# Patient Record
Sex: Male | Born: 1935 | Race: White | Hispanic: No | Marital: Married | Smoking: Former smoker
Health system: Southern US, Community
[De-identification: ages and names within clinical notes are randomized; demographics above are authoritative.]

## PROBLEM LIST (undated history)

## (undated) DIAGNOSIS — S92919A Unspecified fracture of unspecified toe(s), initial encounter for closed fracture: Secondary | ICD-10-CM

## (undated) DIAGNOSIS — C801 Malignant (primary) neoplasm, unspecified: Secondary | ICD-10-CM

## (undated) DIAGNOSIS — N6009 Solitary cyst of unspecified breast: Secondary | ICD-10-CM

## (undated) DIAGNOSIS — S92909A Unspecified fracture of unspecified foot, initial encounter for closed fracture: Secondary | ICD-10-CM

## (undated) DIAGNOSIS — I459 Conduction disorder, unspecified: Secondary | ICD-10-CM

## (undated) HISTORY — PX: APPENDECTOMY: SHX54

## (undated) HISTORY — PX: TONSILLECTOMY: SUR1361

---

## 2014-06-13 ENCOUNTER — Emergency Department (HOSPITAL_COMMUNITY)
Admission: EM | Admit: 2014-06-13 | Discharge: 2014-06-13 | Disposition: A | Discharge status: Home / Self Care | Payer: Non-veteran care | Attending: Emergency Medicine | Admitting: Emergency Medicine

## 2014-06-13 ENCOUNTER — Encounter (HOSPITAL_COMMUNITY): Payer: Self-pay | Admitting: Emergency Medicine

## 2014-06-13 ENCOUNTER — Emergency Department (HOSPITAL_COMMUNITY): Payer: Non-veteran care

## 2014-06-13 DIAGNOSIS — Z87438 Personal history of other diseases of male genital organs: Secondary | ICD-10-CM | POA: Insufficient documentation

## 2014-06-13 DIAGNOSIS — Z79899 Other long term (current) drug therapy: Secondary | ICD-10-CM | POA: Insufficient documentation

## 2014-06-13 DIAGNOSIS — Z8679 Personal history of other diseases of the circulatory system: Secondary | ICD-10-CM | POA: Diagnosis not present

## 2014-06-13 DIAGNOSIS — Y998 Other external cause status: Secondary | ICD-10-CM | POA: Insufficient documentation

## 2014-06-13 DIAGNOSIS — W1789XA Other fall from one level to another, initial encounter: Secondary | ICD-10-CM | POA: Insufficient documentation

## 2014-06-13 DIAGNOSIS — Z87891 Personal history of nicotine dependence: Secondary | ICD-10-CM | POA: Insufficient documentation

## 2014-06-13 DIAGNOSIS — Y9289 Other specified places as the place of occurrence of the external cause: Secondary | ICD-10-CM | POA: Insufficient documentation

## 2014-06-13 DIAGNOSIS — Y9389 Activity, other specified: Secondary | ICD-10-CM | POA: Diagnosis not present

## 2014-06-13 DIAGNOSIS — R4182 Altered mental status, unspecified: Secondary | ICD-10-CM | POA: Diagnosis present

## 2014-06-13 DIAGNOSIS — M6281 Muscle weakness (generalized): Secondary | ICD-10-CM | POA: Insufficient documentation

## 2014-06-13 DIAGNOSIS — Z8546 Personal history of malignant neoplasm of prostate: Secondary | ICD-10-CM | POA: Insufficient documentation

## 2014-06-13 DIAGNOSIS — Z8781 Personal history of (healed) traumatic fracture: Secondary | ICD-10-CM | POA: Insufficient documentation

## 2014-06-13 DIAGNOSIS — R531 Weakness: Secondary | ICD-10-CM

## 2014-06-13 DIAGNOSIS — W19XXXA Unspecified fall, initial encounter: Secondary | ICD-10-CM

## 2014-06-13 HISTORY — DX: Conduction disorder, unspecified: I45.9

## 2014-06-13 HISTORY — DX: Malignant (primary) neoplasm, unspecified: C80.1

## 2014-06-13 HISTORY — DX: Unspecified fracture of unspecified toe(s), initial encounter for closed fracture: S92.919A

## 2014-06-13 HISTORY — DX: Unspecified fracture of unspecified foot, initial encounter for closed fracture: S92.909A

## 2014-06-13 HISTORY — DX: Solitary cyst of unspecified breast: N60.09

## 2014-06-13 LAB — URINALYSIS, ROUTINE W REFLEX MICROSCOPIC
BILIRUBIN URINE: NEGATIVE
Glucose, UA: NEGATIVE mg/dL
Hgb urine dipstick: NEGATIVE
Ketones, ur: NEGATIVE mg/dL
LEUKOCYTES UA: NEGATIVE
Nitrite: NEGATIVE
PROTEIN: NEGATIVE mg/dL
Specific Gravity, Urine: 1.013 (ref 1.005–1.030)
Urobilinogen, UA: 1 mg/dL (ref 0.0–1.0)
pH: 6.5 (ref 5.0–8.0)

## 2014-06-13 LAB — COMPREHENSIVE METABOLIC PANEL
ALT: 16 U/L (ref 0–53)
AST: 25 U/L (ref 0–37)
Albumin: 4.1 g/dL (ref 3.5–5.2)
Alkaline Phosphatase: 69 U/L (ref 39–117)
Anion gap: 7 (ref 5–15)
BUN: 15 mg/dL (ref 6–23)
CO2: 29 mmol/L (ref 19–32)
Calcium: 8.6 mg/dL (ref 8.4–10.5)
Chloride: 95 mEq/L — ABNORMAL LOW (ref 96–112)
Creatinine, Ser: 0.9 mg/dL (ref 0.50–1.35)
GFR calc Af Amer: 87 mL/min — ABNORMAL LOW (ref >90)
GFR, EST NON AFRICAN AMERICAN: 75 mL/min — AB (ref >90)
Glucose, Bld: 100 mg/dL — ABNORMAL HIGH (ref 70–99)
POTASSIUM: 3.9 mmol/L (ref 3.5–5.1)
Sodium: 131 mmol/L — ABNORMAL LOW (ref 135–145)
Total Bilirubin: 0.6 mg/dL (ref 0.3–1.2)
Total Protein: 6.6 g/dL (ref 6.0–8.3)

## 2014-06-13 LAB — CBC WITH DIFFERENTIAL/PLATELET
BASOS ABS: 0 10*3/uL (ref 0.0–0.1)
Basophils Relative: 1 % (ref 0–1)
Eosinophils Absolute: 0.2 10*3/uL (ref 0.0–0.7)
Eosinophils Relative: 4 % (ref 0–5)
HCT: 37.4 % — ABNORMAL LOW (ref 39.0–52.0)
HEMOGLOBIN: 12.4 g/dL — AB (ref 13.0–17.0)
Lymphocytes Relative: 38 % (ref 12–46)
Lymphs Abs: 2.3 10*3/uL (ref 0.7–4.0)
MCH: 29.6 pg (ref 26.0–34.0)
MCHC: 33.2 g/dL (ref 30.0–36.0)
MCV: 89.3 fL (ref 78.0–100.0)
MONO ABS: 0.5 10*3/uL (ref 0.1–1.0)
Monocytes Relative: 8 % (ref 3–12)
NEUTROS PCT: 49 % (ref 43–77)
Neutro Abs: 3 10*3/uL (ref 1.7–7.7)
PLATELETS: 225 10*3/uL (ref 150–400)
RBC: 4.19 MIL/uL — AB (ref 4.22–5.81)
RDW: 12.9 % (ref 11.5–15.5)
WBC: 6.1 10*3/uL (ref 4.0–10.5)

## 2014-06-13 LAB — TROPONIN I: Troponin I: <0.03 ng/mL (ref <0.031)

## 2014-06-13 NOTE — ED Notes (Signed)
Pt with Hx of prostate cancer with seeding implant in place, c/o mild confusion and disorientation x 1 week. Last seen normal one week ago. Pt A & O x 4. Denies any head injury, recent fall to knees yesterday.

## 2014-06-13 NOTE — ED Notes (Signed)
Patient returned from X-ray 

## 2014-06-13 NOTE — ED Notes (Signed)
Urinal at bedside, pt and family is aware of the need for sample.

## 2014-06-13 NOTE — ED Provider Notes (Signed)
CSN: [id number]     Arrival date & time 06/13/14  1850 History   First MD Initiated Contact with Patient 06/13/14 1908     Chief Complaint  Patient presents with  . Weakness  . Altered Mental Status     (Consider location/radiation/quality/duration/timing/severity/associated sxs/prior Treatment) Patient is a 81 y.o. male presenting with fall.  Fall This is a new problem. The current episode started yesterday. The problem occurs constantly. The problem has not changed since onset.Pertinent negatives include no chest pain, no abdominal pain, no headaches and no shortness of breath. Nothing aggravates the symptoms. Nothing relieves the symptoms. He has tried nothing for the symptoms.    Past Medical History  Diagnosis Date  . Cancer     prostate cancer  . Heart block   . Cyst of breast   . Fracture, foot   . Toe fracture    Past Surgical History  Procedure Laterality Date  . Appendectomy    . Tonsillectomy     History reviewed. No pertinent family history. History  Substance Use Topics  . Smoking status: Former Research scientist (life sciences)  . Smokeless tobacco: Not on file  . Alcohol Use: No    Review of Systems  Respiratory: Negative for shortness of breath.   Cardiovascular: Negative for chest pain.  Gastrointestinal: Negative for abdominal pain.  Neurological: Negative for headaches.  All other systems reviewed and are negative.     Allergies  Review of patient's allergies indicates no known allergies.  Home Medications   Prior to Admission medications   Medication Sig Start Date End Date Taking? Authorizing Provider  carvedilol (COREG) 12.5 MG tablet Take 6.25 mg by mouth 2 (two) times daily with a meal.   Yes Historical Provider, MD  chlorthalidone (HYGROTON) 25 MG tablet Take 12.5 mg by mouth daily.   Yes Historical Provider, MD  lisinopril (PRINIVIL,ZESTRIL) 40 MG tablet Take 40 mg by mouth daily.   Yes Historical Provider, MD  Multiple Vitamins-Minerals (PRESERVISION AREDS 2  PO) Take by mouth.   Yes Historical Provider, MD  omeprazole (PRILOSEC) 20 MG capsule Take 20 mg by mouth daily.   Yes Historical Provider, MD  simvastatin (ZOCOR) 40 MG tablet Take 40 mg by mouth daily.   Yes Historical Provider, MD   BP 128/74 mmHg  Pulse 61  Temp(Src) 97.6 F (36.4 C) (Oral)  Resp 14  SpO2 98% Physical Exam  Constitutional: He is oriented to person, place, and time. He appears well-developed and well-nourished.  HENT:  Head: Normocephalic and atraumatic.  Eyes: Conjunctivae and EOM are normal.  Neck: Normal range of motion. Neck supple.  Cardiovascular: Normal rate, regular rhythm and normal heart sounds.   Pulmonary/Chest: Effort normal and breath sounds normal. No respiratory distress.  Abdominal: He exhibits no distension. There is no tenderness. There is no rebound and no guarding.  Musculoskeletal: Normal range of motion.  Neurological: He is alert and oriented to person, place, and time. He has normal strength and normal reflexes. No cranial nerve deficit or sensory deficit. Coordination normal.  Skin: Skin is warm and dry.  Vitals reviewed.   ED Course  Procedures (including critical care time) Labs Review Labs Reviewed  CBC WITH DIFFERENTIAL - Abnormal; Notable for the following:    RBC 4.19 (*)    Hemoglobin 12.4 (*)    HCT 37.4 (*)    All other components within normal limits  COMPREHENSIVE METABOLIC PANEL - Abnormal; Notable for the following:    Sodium 131 (*)  Chloride 95 (*)    Glucose, Bld 100 (*)    GFR calc non Af Amer 75 (*)    GFR calc Af Amer 87 (*)    All other components within normal limits  URINALYSIS, ROUTINE W REFLEX MICROSCOPIC  TROPONIN I    Imaging Review No results found.   EKG Interpretation   Date/Time:  Tuesday June 13 2014 19:01:59 EST Ventricular Rate:  62 PR Interval:  193 QRS Duration: 116 QT Interval:  424 QTC Calculation: 431 R Axis:   62 Text Interpretation:  Sinus rhythm Incomplete right bundle  branch block  Low voltage, precordial leads No old tracing to compare Confirmed by  Debby Freiberg [phone]) on 06/13/2014 7:34:04 PM      MDM   Final diagnoses:  Fall  Generalized weakness    81 y.o. male with pertinent PMH of CAD presents with fall from standing yesterday.  No antecedent or following chest pain or other symptoms.  Pt at baseline at time of my exam according to family.  No New symptoms in 6 hours. Wu as above unremarkable.  DC home in stable condition.  I have reviewed all laboratory and imaging studies if ordered as above  1. Generalized weakness   2. Massie Kluver, MD 06/16/14 1550

## 2014-06-13 NOTE — ED Notes (Signed)
Patient stated yesterday he had been sleeping on the couch, he got dizzy and fell on his knees. Yesterday, patients grand daughter reports patient was altered yesterday but reports he is close to his base line. Pt is alert, oriented x 4, and hard of hearing.

## 2014-06-13 NOTE — Discharge Instructions (Signed)

## 2014-06-13 NOTE — ED Notes (Signed)
Patient transported to CT 

## 2016-06-19 IMAGING — CT CT HEAD W/O CM
1 series · 16 of 30 positions shown, 20 images · non-contrast
Comparison: None.

CLINICAL DATA: Dizziness with fall yesterday

EXAM:
CT HEAD WITHOUT CONTRAST
TECHNIQUE: Contiguous axial images were obtained from the base of the skull
through the vertex without intravenous contrast.

[Series 2: headseq 4.8 h45s · axial · 0.43mm/px · z∈[+1197,+1349]mm · 16 of 36 slices shown, 20 images]
[im 2/36  brain]
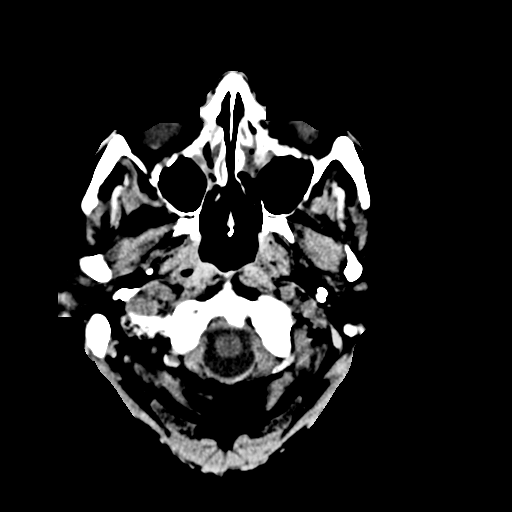
[im 2/36  bone]
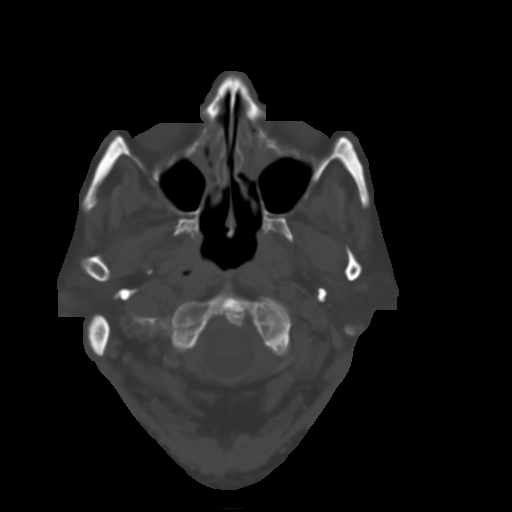
[im 4/36  brain]
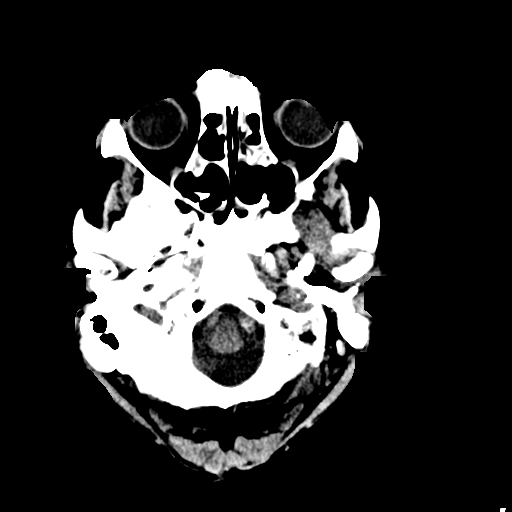
[im 7/36  brain]
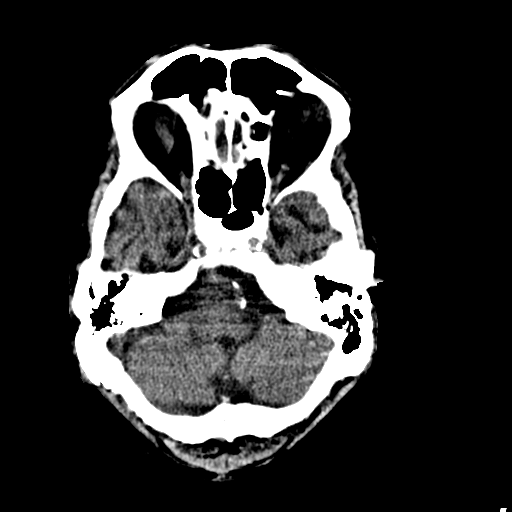
[im 9/36  brain]
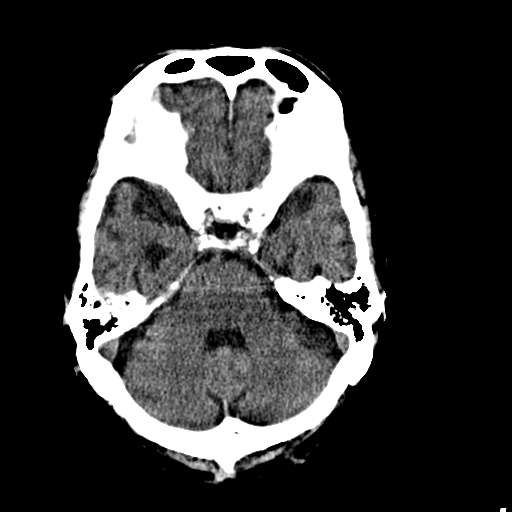
[im 10/36  brain]
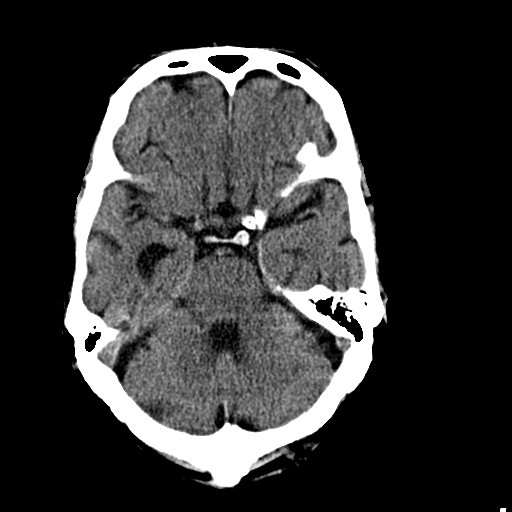
[im 10/36  bone]
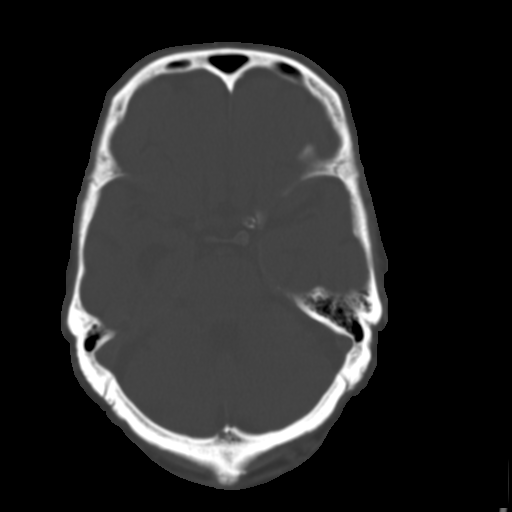
[im 13/36  brain]
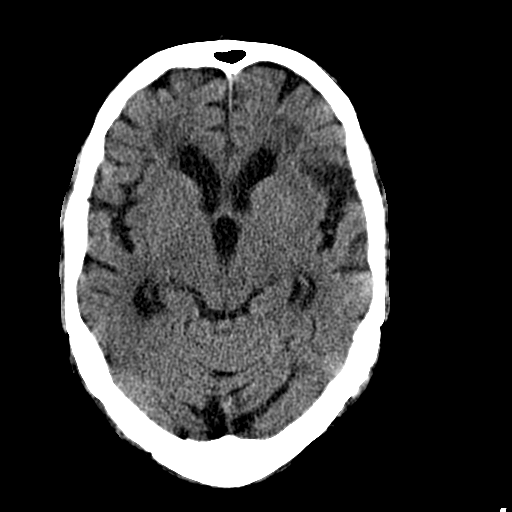
[im 15/36  brain]
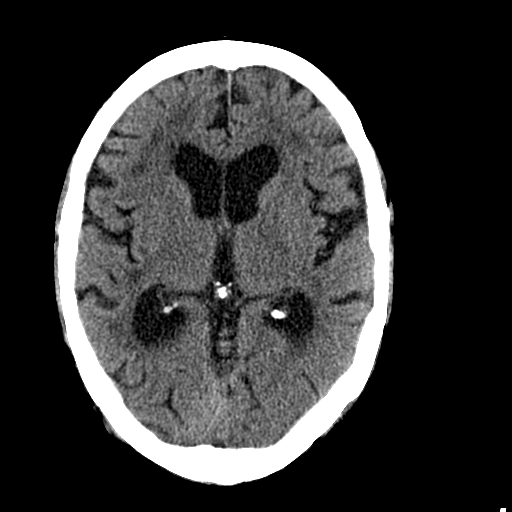
[im 17/36  brain]
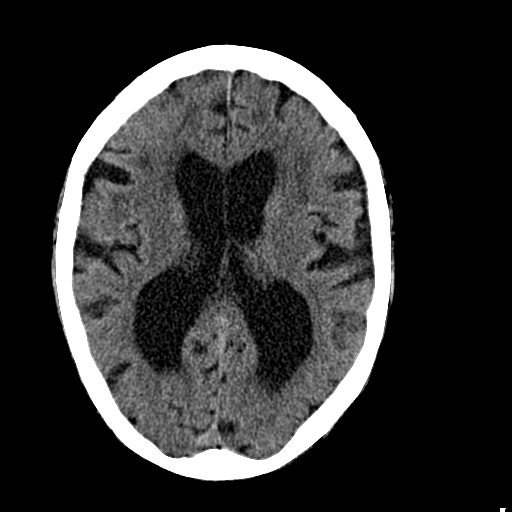
[im 19/36  brain]
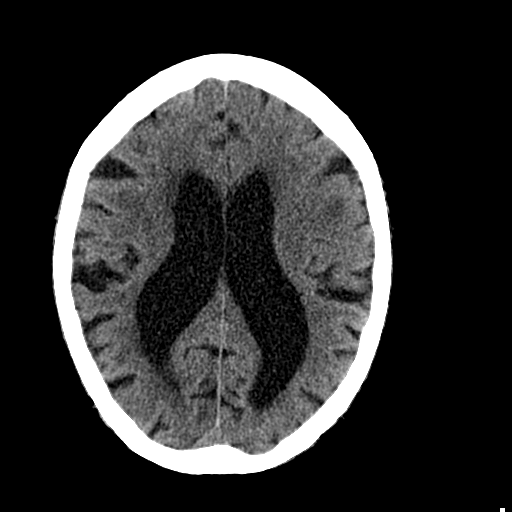
[im 19/36  bone]
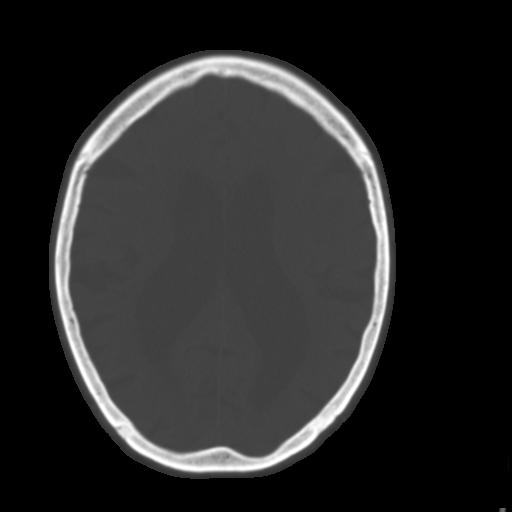
[im 21/36  brain]
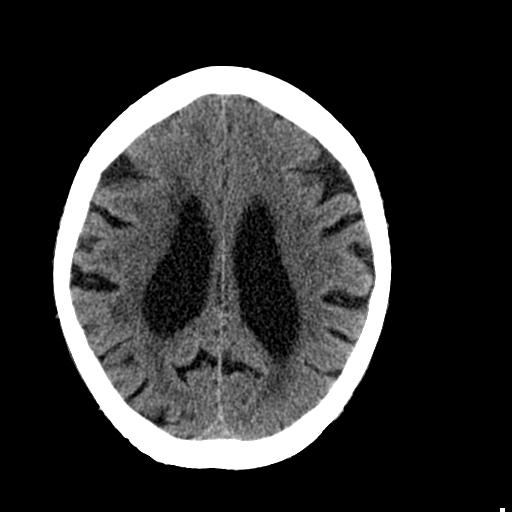
[im 23/36  brain]
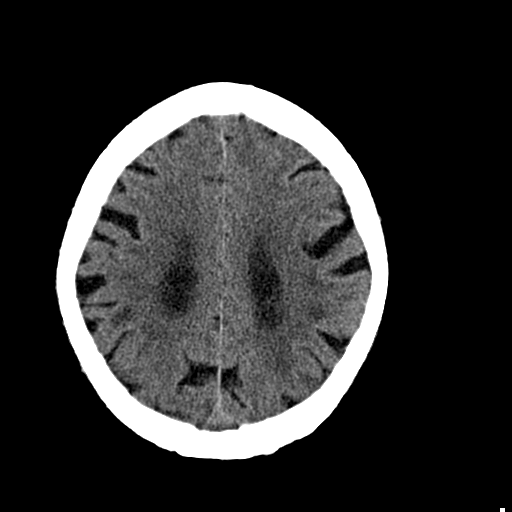
[im 26/36  brain]
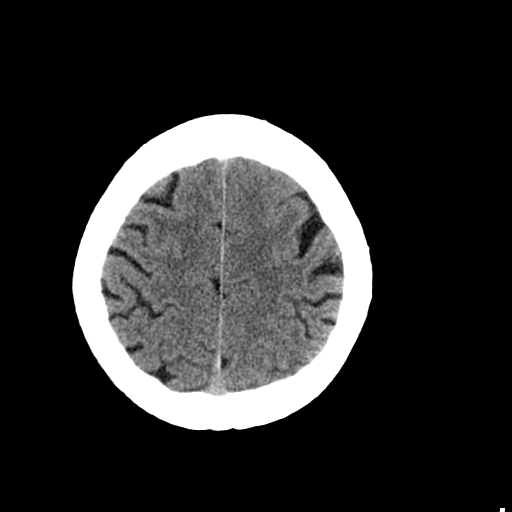
[im 27/36  brain]
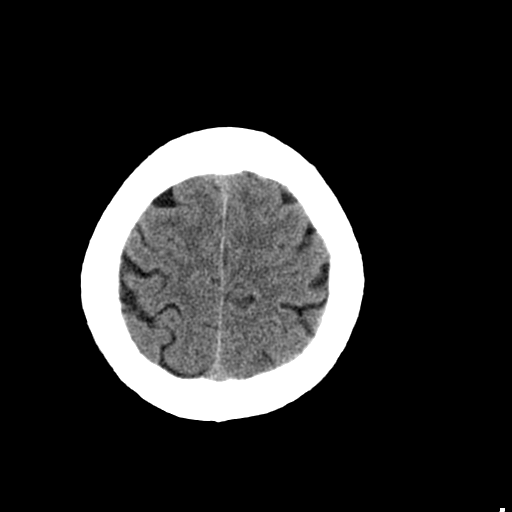
[im 27/36  bone]
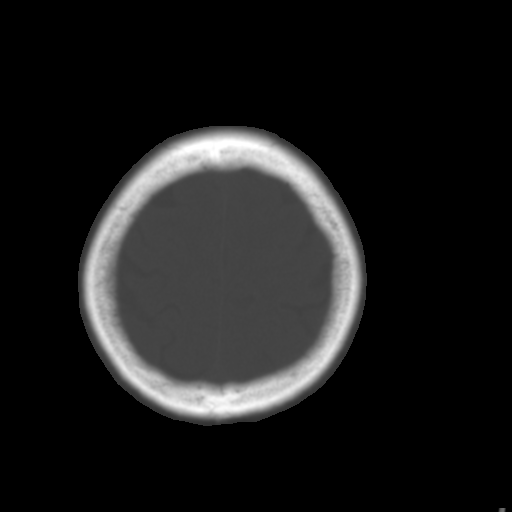
[im 29/36  brain]
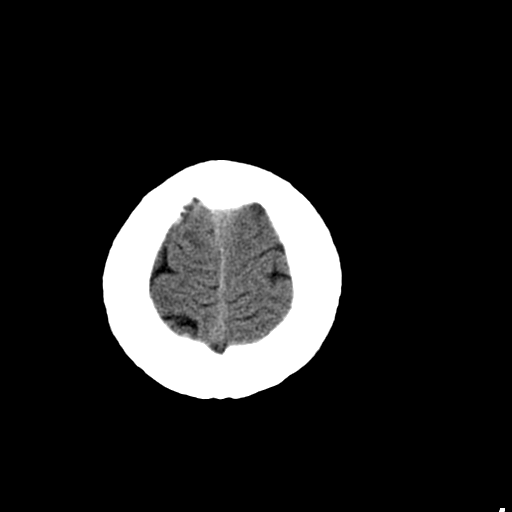
[im 32/36  brain]
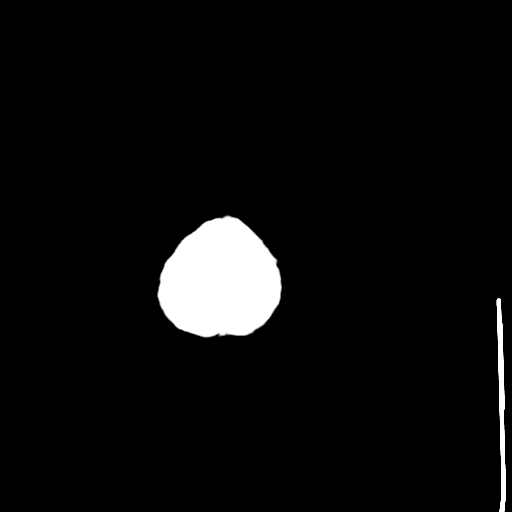
[im 34/36  brain]
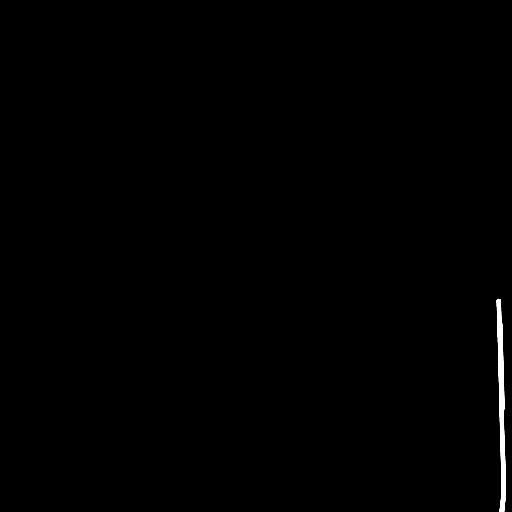

[16 of 30 positions shown; findings below may reference images not displayed]

FINDINGS: The bony calvarium is intact. A tiny radiopaque density is noted
adjacent to the soft tissues in the right frontal region which may
be related to the recent injury. No other soft tissue abnormality is
seen. Diffuse atrophic changes are noted. Mild chronic white matter
ischemic change is seen. There are no findings to suggest acute
hemorrhage, acute infarction or space-occupying mass lesion.
IMPRESSION: Chronic atrophic and ischemic changes.

Radiopaque density in the soft tissues in the right frontal scalp
which may represent a small foreign body related to the recent
injury.

## 2022-11-24 DEATH — deceased
